# Patient Record
Sex: Female | Born: 1995 | Race: Black or African American | Hispanic: No | Marital: Single | Smoking: Never smoker
Health system: Southern US, Community
[De-identification: ages and names within clinical notes are randomized; demographics above are authoritative.]

## PROBLEM LIST (undated history)

## (undated) DIAGNOSIS — L309 Dermatitis, unspecified: Secondary | ICD-10-CM

## (undated) DIAGNOSIS — L409 Psoriasis, unspecified: Secondary | ICD-10-CM

## (undated) HISTORY — PX: FOOT SURGERY: SHX648

---

## 2018-12-29 ENCOUNTER — Other Ambulatory Visit: Payer: Self-pay

## 2018-12-29 DIAGNOSIS — Z20822 Contact with and (suspected) exposure to covid-19: Secondary | ICD-10-CM

## 2018-12-30 LAB — NOVEL CORONAVIRUS, NAA: SARS-CoV-2, NAA: NOT DETECTED

## 2019-04-14 ENCOUNTER — Ambulatory Visit (HOSPITAL_COMMUNITY)
Admission: EM | Admit: 2019-04-14 | Discharge: 2019-04-14 | Disposition: A | Discharge status: Home / Self Care | Payer: Managed Care, Other (non HMO) | Attending: Physician Assistant | Admitting: Physician Assistant

## 2019-04-14 ENCOUNTER — Encounter (HOSPITAL_COMMUNITY): Payer: Self-pay

## 2019-04-14 ENCOUNTER — Ambulatory Visit (INDEPENDENT_AMBULATORY_CARE_PROVIDER_SITE_OTHER): Payer: Managed Care, Other (non HMO)

## 2019-04-14 ENCOUNTER — Other Ambulatory Visit: Payer: Self-pay

## 2019-04-14 DIAGNOSIS — T148XXA Other injury of unspecified body region, initial encounter: Secondary | ICD-10-CM

## 2019-04-14 DIAGNOSIS — S93105A Unspecified dislocation of left toe(s), initial encounter: Secondary | ICD-10-CM | POA: Diagnosis not present

## 2019-04-14 DIAGNOSIS — M79675 Pain in left toe(s): Secondary | ICD-10-CM | POA: Diagnosis not present

## 2019-04-14 MED ORDER — IBUPROFEN 600 MG PO TABS: 600.0000 mg | ORAL_TABLET | Freq: Four times a day (QID) | ORAL | 0 refills | Status: DC | PRN

## 2019-04-14 NOTE — ED Triage Notes (Signed)
Pt states she hit her toe on the corner of the wall. Pt states she thinks she broke her toe. This happened yesterday.

## 2019-04-14 NOTE — Discharge Instructions (Signed)
Wear the boot and keep your toes taped. Take the ibuprofen every 6 hours for pain  Please follow-up with the orthopedics clinic that was supplied.  Give them a call today to see if you can be seen sometime this later this week.  I free to follow-up as there is concerned about possible redislocation.

## 2019-04-14 NOTE — ED Provider Notes (Signed)
[facility]    CSN: [id number] Arrival date & time: 04/14/19  2409      History   Chief Complaint Chief Complaint  Patient presents with  . Toe Injury    HPI Marcia Berger is a 24 y.o. female.   Reports urgent care today for acute injury to left toe.  She reports she was running in her apartment last night when she slammed this into a door jam.  She noted immediate pain in her fourth toe on her left foot.  She denies remembering any bleeding.  She does endorse some deformity in which she believes is angulation towards the outside of her foot of the toe.  Is also discoloration slight swelling.  She reports her mother is a Engineer, civil (consulting) and she instructed her to wrap the toe and be seen today.  She reports 8 out of 10 pain with walking but while resting the pain is not very significant.  She denies numbness or tingling in the toe.     History reviewed. No pertinent past medical history.  There are no problems to display for this patient.   History reviewed. No pertinent surgical history.  OB History   No obstetric history on file.      Home Medications    Prior to Admission medications   Medication Sig Start Date End Date Taking? Authorizing Provider  ibuprofen (ADVIL) 600 MG tablet Take 1 tablet (600 mg total) by mouth every 6 (six) hours as needed. 04/14/19   Sonia Stickels, Veryl Speak, PA-C    Family History History reviewed. No pertinent family history.  Social History Social History   Tobacco Use  . Smoking status: Never Smoker  . Smokeless tobacco: Never Used  Substance Use Topics  . Alcohol use: Never  . Drug use: Never     Allergies   Patient has no known allergies.   Review of Systems Review of Systems  Musculoskeletal: Positive for gait problem.       Left toe pain  Skin: Positive for color change. Negative for wound.     Physical Exam Triage Vital Signs ED Triage Vitals  Enc Vitals Group     BP 04/14/19 1019 116/67     Pulse Rate 04/14/19  1019 69     Resp 04/14/19 1019 16     Temp 04/14/19 1019 97.8 F (36.6 C)     Temp Source 04/14/19 1019 Oral     SpO2 04/14/19 1019 100 %     Weight 04/14/19 1017 151 lb (68.5 kg)     Height --      Head Circumference --      Peak Flow --      Pain Score 04/14/19 1017 8     Pain Loc --      Pain Edu? --      Excl. in [place]? --    No data found.  Updated Vital Signs BP 116/67 (BP Location: Right Arm)   Pulse 69   Temp 97.8 F (36.6 C) (Oral)   Resp 16   Wt 151 lb (68.5 kg)   LMP 11/14/2018 Comment: implant, denies pregnancy  SpO2 100%   Visual Acuity Right Eye Distance:   Left Eye Distance:   Bilateral Distance:    Right Eye Near:   Left Eye Near:    Bilateral Near:     Physical Exam Vitals and nursing note reviewed.  Constitutional:      General: She is not in acute distress.  Appearance: She is well-developed. She is not ill-appearing.  HENT:     Head: Normocephalic and atraumatic.  Eyes:     Conjunctiva/sclera: Conjunctivae normal.  Cardiovascular:     Rate and Rhythm: Normal rate.  Pulmonary:     Effort: Pulmonary effort is normal. No respiratory distress.  Musculoskeletal:     Cervical back: Neck supple.     Comments: Left fourth toe with erythema and mild ecchymotic changes.  There is a very small abrasion proximal to the PIP.  Toe is in line however there is indentation deformity at the PIP suggestive of dislocation.  Good sensation and cap refill.  There is mild tenderness at the MTP joint.   Skin:    General: Skin is warm and dry.  Neurological:     Mental Status: She is alert.      UC Treatments / Results  Labs (all labs ordered are listed, but only abnormal results are displayed) Labs Reviewed - No data to display  EKG   Radiology DG Toe 4th Left  Result Date: 04/14/2019 CLINICAL DATA:  Post attempted reduction EXAM: LEFT FOURTH TOE COMPARISON:  Earlier same day FINDINGS: There is persistent abnormal alignment at the proximal  interphalangeal joint of the fourth digit. The middle phalanx is laterally and now dorsally positioned relative to the proximal phalanx. Small curvilinear avulsed fragment is again noted. IMPRESSION: Persistent dislocation at the proximal interphalangeal joint of the fourth digit. The middle phalanx is laterally and now dorsally positioned relative to the proximal phalanx. Small curvilinear avulsed fragment is again noted. Electronically Signed   By: Guadlupe Spanish M.D.   On: 04/14/2019 12:31   DG Toe 4th Left  Result Date: 04/14/2019 CLINICAL DATA:  hit toe on wall.  Pain, swelling EXAM: LEFT FOURTH TOE COMPARISON:  None. FINDINGS: Plantar dislocation at the left 4th toe PIP joint. Small avulsed fragment noted lateral to the distal aspect of the proximal phalanx. No additional acute bony abnormality. IMPRESSION: Plantar dislocation at the left 4th toe PIP joint with small avulsed bone fragment laterally. Electronically Signed   By: Charlett Nose M.D.   On: 04/14/2019 11:01    Procedures Orthopedic Injury Treatment  Date/Time: 04/14/2019 12:09 PM Performed by: Mardella Layman, MD Authorized by: Mardella Layman, MD   Consent:    Consent obtained:  Verbal   Consent given by:  Patient   Risks discussed:  Fracture, recurrent dislocation and irreducible dislocation   Alternatives discussed:  No treatment, immobilization and referralInjury location: toe Location details: left fourth toe Injury type: dislocation Dislocation type: PIP Pre-procedure neurovascular assessment: neurovascularly intact Pre-procedure distal perfusion: normal Pre-procedure neurological function: normal Pre-procedure range of motion: reduced Anesthesia: digital block  Anesthesia: Local anesthesia used: yes Local Anesthetic: lidocaine 2% without epinephrine Anesthetic total: 2.5 mL Manipulation performed: yes Reduction successful: Partially there is now slight dorsal location and lateral as opposed to previous plantar  dislocation. X-ray confirmed reduction: yes Immobilization: tape (Postoperative boot) Post-procedure neurovascular assessment: post-procedure neurovascularly intact Post-procedure distal perfusion: normal Post-procedure neurological function: normal Patient tolerance: patient tolerated the procedure well with no immediate complications    (including critical care time)  Medications Ordered in UC Medications - No data to display  Initial Impression / Assessment and Plan / UC Course  I have reviewed the triage vital signs and the nursing notes.  Pertinent labs & imaging results that were available during my care of the patient were reviewed by me and considered in my medical decision making (see chart for  details).     #Plantar dislocation of fourth toe on left foot w/ with small avulsion fracture Patient 24 year old female presenting with acute left toe injury sustained yesterday.  Though there is a small abrasion at the PIP do not believe that there is communication between this wound and fracture/dislocation.  Clinical and radiographic evidence of plantar dislocation at PIP.  Relocation attempted by Dr. Mannie Stabile attending physician on.  There was some improvement and believe that there was relocation however immediately after relocation we have to redislocated.  Radiographic evidence now of slight dorsal and lateral displacement.  Given this I do believe there is a likely ligamentous injury or tendon injury.  Pulse motor and sensory all intact.   -Will buddy tape applied and put in postop boot instructions to follow-up with orthopedics. -Orthopedic follow-up recommended -Ibuprofen and Tylenol for pain management. 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  Final Clinical Impressions(s) / UC Diagnoses   Final diagnoses:  Closed dislocation of fourth toe of left foot, initial  encounter  Avulsion fracture     Discharge Instructions     Wear the boot and keep your toes taped. Take the ibuprofen every 6 hours for pain  Please follow-up with the orthopedics clinic that was supplied.  Give them a call today to see if you can be seen sometime this later this week.  I free to follow-up as there is concerned about possible redislocation.      ED Prescriptions    Medication Sig Dispense Auth. Provider   ibuprofen (ADVIL) 600 MG tablet Take 1 tablet (600 mg total) by mouth every 6 (six) hours as needed. 30 tablet Dakotah Heiman, Marguerita Beards, PA-C     PDMP not reviewed this encounter.   Purnell Shoemaker, PA-C 04/14/19 1300

## 2021-09-27 IMAGING — DX DG TOE 4TH 2+V*L*
3 series · 3 of 3 positions shown · non-contrast
Comparison: None.

CLINICAL DATA: hit toe on wall.  Pain, swelling

EXAM:
LEFT FOURTH TOE

[toe ap]
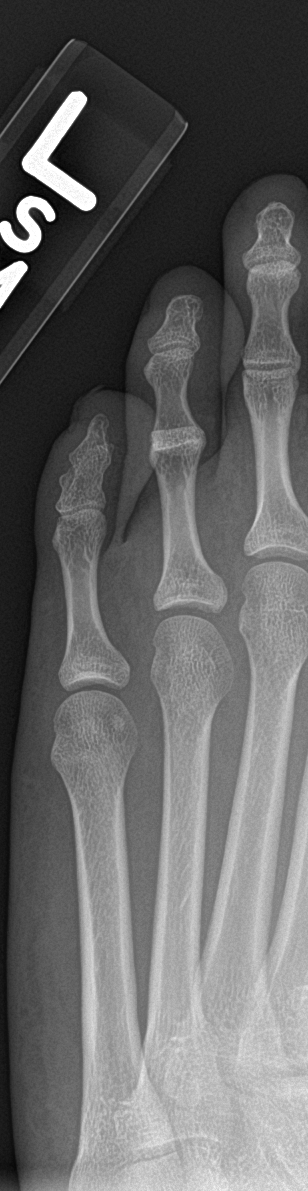

[toe obl]
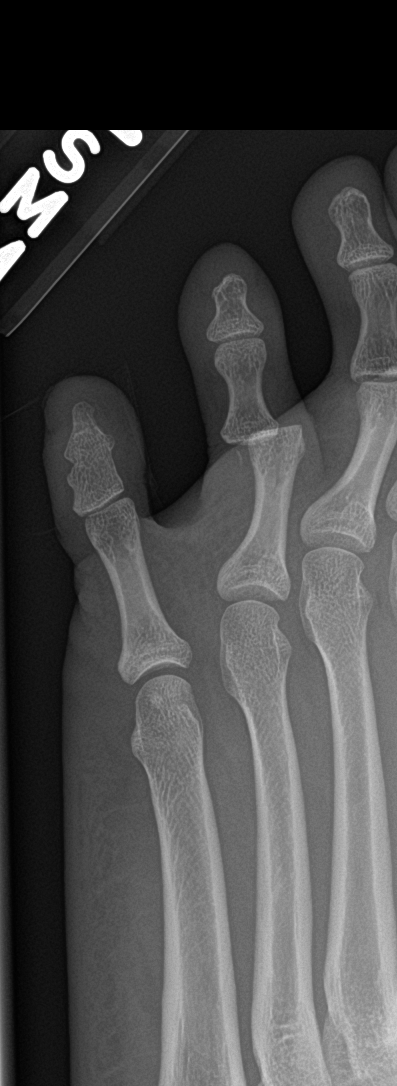

[toe lat]
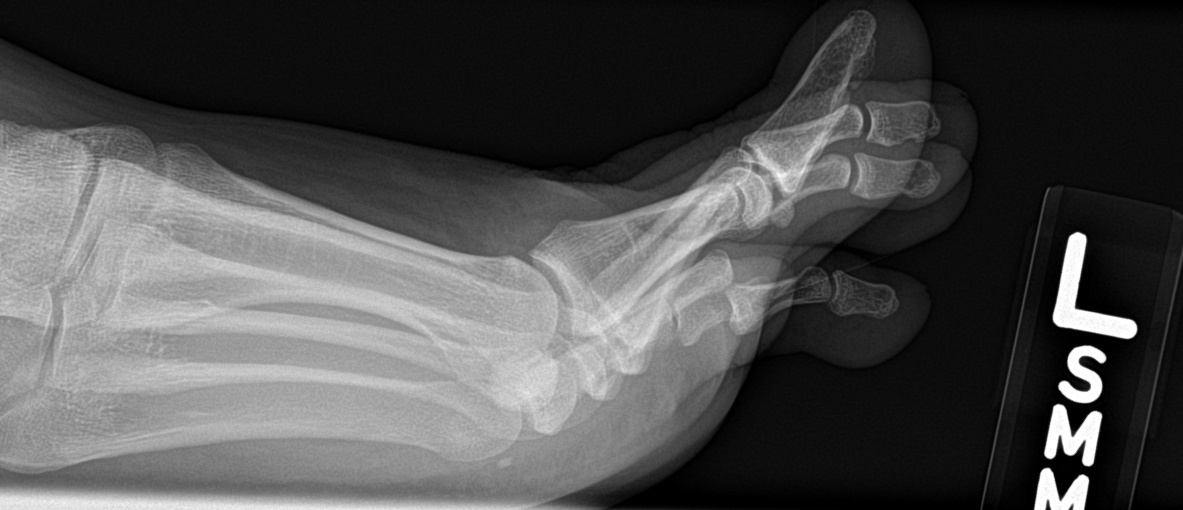

[3 of 3 positions shown; findings below may reference images not displayed]

FINDINGS: Plantar dislocation at the left 4th toe PIP joint. Small avulsed
fragment noted lateral to the distal aspect of the proximal phalanx.
No additional acute bony abnormality.
IMPRESSION: Plantar dislocation at the left 4th toe PIP joint with small avulsed
bone fragment laterally.

## 2022-04-18 ENCOUNTER — Ambulatory Visit (INDEPENDENT_AMBULATORY_CARE_PROVIDER_SITE_OTHER): Payer: 59

## 2022-04-18 ENCOUNTER — Ambulatory Visit (HOSPITAL_COMMUNITY)
Admission: EM | Admit: 2022-04-18 | Discharge: 2022-04-18 | Disposition: A | Discharge status: Home / Self Care | Payer: 59

## 2022-04-18 ENCOUNTER — Encounter (HOSPITAL_COMMUNITY): Payer: Self-pay

## 2022-04-18 DIAGNOSIS — M25471 Effusion, right ankle: Secondary | ICD-10-CM | POA: Diagnosis not present

## 2022-04-18 DIAGNOSIS — M25571 Pain in right ankle and joints of right foot: Secondary | ICD-10-CM

## 2022-04-18 HISTORY — DX: Dermatitis, unspecified: L30.9

## 2022-04-18 HISTORY — DX: Psoriasis, unspecified: L40.9

## 2022-04-18 NOTE — ED Provider Notes (Signed)
[place]    CSN: [id number] Arrival date & time: 04/18/22  1639      History   Chief Complaint Chief Complaint  Patient presents with   Foot Pain    HPI Marcia Berger is a 27 y.o. female.   Patient presents to clinic for right ankle and foot swelling.  She had a severe sprain in 2013 while dancing, was evaluated at [facility] and had negative imaging.  Her right ankle pain has been worsening over the past 2 weeks.  Earlier this week she went to see [employer] in concert in [place], a 6 hour drive there and 6 hour drive back.  Her mother got her a compression stocking for this trip and she wore it.  Her swelling got worse when she returned Wednesday and throughout the past few days.  She denies chest pain, shortness of breath, fever, calf pain, or any new ankle injuries. Ambulatory.  Reports right outer ankle bruising / discoloration with no known injury.   The history is provided by the patient and medical records.  Foot Pain Pertinent negatives include no chest pain, no abdominal pain and no shortness of breath.    Past Medical History:  Diagnosis Date   Eczema    Psoriasis     There are no problems to display for this patient.   Past Surgical History:  Procedure Laterality Date   FOOT SURGERY Left     OB History   No obstetric history on file.      Home Medications    Prior to Admission medications   Medication Sig Start Date End Date Taking? Authorizing Provider  Etonogestrel (NEXPLANON [place]) Inject into the skin.   Yes [provider]    Family History History reviewed. No pertinent family history.  Social History Social History   Tobacco Use   Smoking status: Never   Smokeless tobacco: Never  Vaping Use   Vaping Use: Never used  Substance Use Topics   Alcohol use: Yes    Comment: socially   Drug use: Never     Allergies   Shellfish allergy   Review of Systems Review of Systems  Constitutional:  Negative for fatigue  and fever.  Respiratory:  Negative for cough and shortness of breath.   Cardiovascular:  Negative for chest pain.  Gastrointestinal:  Negative for abdominal pain.  Musculoskeletal:  Positive for joint swelling.     Physical Exam Triage Vital Signs ED Triage Vitals  Enc Vitals Group     BP 04/18/22 1706 (!) 158/77     Pulse Rate 04/18/22 1706 85     Resp 04/18/22 1706 16     Temp 04/18/22 1706 98.2 F (36.8 C)     Temp Source 04/18/22 1706 Oral     SpO2 04/18/22 1706 98 %     Weight 04/18/22 1706 170 lb (77.1 kg)     Height 04/18/22 1706 5\' 4"  (1.626 m)     Head Circumference --      Peak Flow --      Pain Score 04/18/22 1704 3     Pain Loc --      Pain Edu? --      Excl. in [place]? --    No data found.  Updated Vital Signs BP (!) 158/77 (BP Location: Right Arm)   Pulse 85   Temp 98.2 F (36.8 C) (Oral)   Resp 16   Ht 5\' 4"  (1.626 m)   Wt 170 lb (77.1  kg)   LMP  (LMP Unknown)   SpO2 98%   BMI 29.18 kg/m   Visual Acuity Right Eye Distance:   Left Eye Distance:   Bilateral Distance:    Right Eye Near:   Left Eye Near:    Bilateral Near:     Physical Exam Vitals and nursing note reviewed.  Constitutional:      Appearance: Normal appearance.  HENT:     Head: Normocephalic and atraumatic.  Cardiovascular:     Rate and Rhythm: Normal rate and regular rhythm.  Pulmonary:     Effort: Pulmonary effort is normal. No respiratory distress.  Musculoskeletal:        General: Swelling and tenderness present. No deformity or signs of injury. Normal range of motion.     Right lower leg: Tenderness and bony tenderness present. 3+ Edema present.     Left lower leg: 2+ Edema present.     Comments: Bony tenderness over medial malleolus with diffuse soft tissue swelling, 3+, nonpitting.  Left lower extremity and ankle swelling 2+ nonpitting.  Range of motion intact, pain with plantar flexion in anterior ankle.   Skin:    General: Skin is warm and dry.     Capillary Refill:  Capillary refill takes less than 2 seconds.  Neurological:     Mental Status: She is alert and oriented to person, place, and time.  Psychiatric:        Mood and Affect: Mood normal.        Behavior: Behavior is cooperative.      UC Treatments / Results  Labs (all labs ordered are listed, but only abnormal results are displayed) Labs Reviewed - No data to display  EKG   Radiology DG Ankle Complete Right  Result Date: 04/18/2022 CLINICAL DATA:  Ankle pain and swelling. EXAM: RIGHT ANKLE - COMPLETE 3+ VIEW COMPARISON:  None Available. FINDINGS: Moderate lateral malleolar soft tissue swelling. The ankle mortise is symmetric and intact. Minimal dorsal talar neck degenerative spurring. Tiny plantar calcaneal heel spur. No acute fracture or dislocation. IMPRESSION: Moderate lateral malleolar soft tissue swelling. No acute fracture. Electronically Signed   By: Yvonne Kendall M.D.   On: 04/18/2022 17:48    Procedures Procedures (including critical care time)  Medications Ordered in UC Medications - No data to display  Initial Impression / Assessment and Plan / UC Course  I have reviewed the triage vital signs and the nursing notes.  Pertinent labs & imaging results that were available during my care of the patient were reviewed by me and considered in my medical decision making (see chart for details).  Vitals in triage reviewed, patient is hemodynamically stable blood pressure improved on re-check.  Patient with bilateral ankle swelling at baseline, right is usually worse than the left due to previous traumatic ankle sprain in 2013.  Over the past few weeks she has had increased in swelling, recently had a prolonged trip with standing at a concert, reports right ankle pain, tenderness and increased swelling.  Brisk capillary refill, pedal pulses 2+.  Decided to repeat ankle imaging since last done in 2013, no fractures or dislocations.  Will provide ankle brace for support and suggested  RICE method, suggested follow-up with [facility] for further evaluation.  Low suspicion for blood clot as patient is without tachycardia, fever, cough, shortness of breath or calf pain. Risk factors are recent prolonged travel, nexplanon use.  Discussed strict return and emergency precautions, follow-up care reviewed.  Patient verbalized  understanding, no questions at this time.    Final Clinical Impressions(s) / UC Diagnoses   Final diagnoses:  Right ankle swelling     Discharge Instructions      Please wear the brace as tolerated to help with your swelling and for foot stability.  Please rest, ice, compress and elevate this extremity when you are able to.  Since this has been ongoing issue, I recommend following up with [facility] sports medicine for further evaluation and treatment.  Please seek immediate care if you develop shortness of breath, fever, numbness, tingling or worsening of pain.     ED Prescriptions   None    PDMP not reviewed this encounter.   Shakeema Lippman, [place], [place] 04/18/22 [phone]

## 2022-04-18 NOTE — ED Triage Notes (Signed)
History of sprain in the right foot years ago. Pain is ongoing but getting worse onset 2 weeks ago. No known recent falls or injuries. No meds tried.

## 2022-04-18 NOTE — Discharge Instructions (Addendum)
Please wear the brace as tolerated to help with your swelling and for foot stability.  Please rest, ice, compress and elevate this extremity when you are able to.  Since this has been ongoing issue, I recommend following up with [facility] health sports medicine for further evaluation and treatment.  Please seek immediate care if you develop shortness of breath, fever, numbness, tingling or worsening of pain.

## 2022-05-15 ENCOUNTER — Ambulatory Visit (INDEPENDENT_AMBULATORY_CARE_PROVIDER_SITE_OTHER): Payer: 59 | Admitting: Obstetrics and Gynecology

## 2022-05-15 ENCOUNTER — Other Ambulatory Visit (HOSPITAL_COMMUNITY)
Admission: RE | Admit: 2022-05-15 | Discharge: 2022-05-15 | Disposition: A | Discharge status: Home / Self Care | Payer: 59 | Source: Ambulatory Visit | Attending: Obstetrics and Gynecology | Admitting: Obstetrics and Gynecology

## 2022-05-15 ENCOUNTER — Encounter: Payer: Self-pay | Admitting: Obstetrics and Gynecology

## 2022-05-15 VITALS — BP 121/83 | HR 78 | Ht 64.0 in | Wt 175.2 lb

## 2022-05-15 DIAGNOSIS — Z3040 Encounter for surveillance of contraceptives, unspecified: Secondary | ICD-10-CM | POA: Diagnosis not present

## 2022-05-15 DIAGNOSIS — Z01419 Encounter for gynecological examination (general) (routine) without abnormal findings: Secondary | ICD-10-CM | POA: Diagnosis not present

## 2022-05-15 NOTE — Progress Notes (Signed)
New GYN in office for annual Currently has nexplanon that was placed at [facility] in 2020 Reports last intercourse in September 2023 Reports that she has never had a pap smear

## 2022-05-15 NOTE — Progress Notes (Signed)
   WELL-WOMAN PHYSICAL & PAP Patient name: Marcia Berger MRN [id number]  Date of birth: [birth year] Chief Complaint:   New Patient (Initial Visit)  History of Present Illness:   Marcia Berger is a 27 y.o. G0P0 African American female being seen today for a routine well-woman exam.  Current complaints: swelling in BLE. Had Nexplanon inserted at [facility] in 2020. 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  PCP: none      does not desire labs No LMP recorded (lmp unknown). Patient has had an implant. The current method of family planning is Nexplanon.  Last pap never had.  Last mammogram: n/a. Family h/o breast cancer: No Last colonoscopy: n/a. Family h/o colorectal cancer: No Review of Systems:   Pertinent items are noted in HPI Denies any headaches, blurred vision, fatigue, shortness of breath, chest pain, abdominal pain, abnormal vaginal discharge/itching/odor/irritation, problems with periods, bowel movements, urination, or intercourse unless otherwise stated above. Pertinent History Reviewed:  Reviewed past medical,surgical, social and family history.  Reviewed problem list, medications and allergies. Physical Assessment:   Vitals:   05/15/22 1038  BP: 121/83  Pulse: 78  Weight: 175 lb 3.2 oz (79.5 kg)  Height:  (1.626 m)  Body mass index is 30.07 kg/m.        Physical Examination:   General appearance - well appearing, and in no distress  Mental status - alert, oriented to person, place, and time  Psych:  She has a normal mood and affect  Skin - warm and dry, normal color, no suspicious lesions noted  Chest - effort normal, all lung fields clear to auscultation bilaterally  Heart - normal rate and regular rhythm  Neck:  midline trachea, no thyromegaly or nodules  Breasts - breasts appear normal, no suspicious masses, no skin or nipple changes or  axillary  nodes  Abdomen - soft, nontender, nondistended, no masses or organomegaly  Pelvic - VULVA: normal appearing vulva with no masses, tenderness or lesions  VAGINA: normal appearing vagina with normal color and discharge, no lesions  CERVIX: normal appearing cervix without discharge or lesions, no CMT  Thin prep pap is done with reflex HR HPV cotesting  UTERUS: uterus is felt to be normal size, shape, consistency and nontender   ADNEXA: No adnexal masses or tenderness noted.  Rectal - deferred  Extremities:  No swelling or varicosities noted  No results found for this or any previous visit (from the past 24 hour(s)).  Assessment & Plan:  1) Well woman exam with routine gynecological exam - Cytology - PAP( [facility])  2) Encounter for surveillance of contraceptive device - Advised that FDA has only approved Nexplanon for 3 yrs, but [place] leaves them for 4 years. Either way, hers is expired and needs to be changed. - Schedule Nexplanon Removal/reinsertion per her schedule   Labs/procedures today: pap  Mammogram at age 35 or sooner if problems Colonoscopy at age 54 or sooner if problems  No orders of the defined types were placed in this encounter.   Meds: No orders of the defined types were placed in this encounter.   Follow-up: Return per patient's schedule, for Nexplanon removal/reinsertion.  Raelyn Mora MSN, CNM 05/15/2022 11:06 AM

## 2022-05-19 LAB — CYTOLOGY - PAP: Diagnosis: NEGATIVE

## 2022-05-20 ENCOUNTER — Encounter: Payer: Self-pay | Admitting: Obstetrics and Gynecology

## 2022-07-10 ENCOUNTER — Ambulatory Visit: Payer: 59 | Admitting: Obstetrics and Gynecology

## 2022-08-15 ENCOUNTER — Ambulatory Visit: Payer: 59 | Admitting: Obstetrics and Gynecology

## 2023-06-19 ENCOUNTER — Ambulatory Visit

## 2023-06-19 VITALS — BP 125/84 | HR 99 | Ht 64.0 in | Wt 179.0 lb

## 2023-06-19 DIAGNOSIS — Z3046 Encounter for surveillance of implantable subdermal contraceptive: Secondary | ICD-10-CM | POA: Diagnosis not present

## 2023-06-19 MED ORDER — ETONOGESTREL 68 MG ~~LOC~~ IMPL
68.0000 mg | DRUG_IMPLANT | Freq: Once | SUBCUTANEOUS | Status: AC
  Administered 2023-06-19: 68 mg via SUBCUTANEOUS

## 2023-06-19 NOTE — Progress Notes (Unsigned)
 Pt presents for Nexplanon removal. [facility] [place] Declined new BC method

## 2023-06-19 NOTE — Progress Notes (Unsigned)
   OFFICE PROCEDURE NOTE  History:  Marcia Berger is a 28 y.o. [phone] here today for Nexplanon  removal and reinsertion.   Past Medical History:  Diagnosis Date  . Eczema   . Psoriasis     Past Surgical History:  Procedure Laterality Date  . FOOT SURGERY Left     The following portions of the patient's history were reviewed and updated as appropriate: allergies, current medications, past family history, past medical history, past social history, past surgical history and problem list.   Health Maintenance:  Normal pap on April 2024.  No mammogram on file d/t age.   Review of Systems:  Review of Systems  All other systems reviewed and are negative.   Objective:  Vitals: BP 125/84   Pulse 99   Ht 5\' 4"  (1.626 m)   Wt 179 lb (81.2 kg)   BMI 30.73 kg/m   Physical Exam Vitals reviewed. Exam conducted with a chaperone present [name], [place]).  Constitutional:      Appearance: Normal appearance.  HENT:     Head: Normocephalic and atraumatic.  Eyes:     Conjunctiva/sclera: Conjunctivae normal.  Cardiovascular:     Rate and Rhythm: Normal rate.  Pulmonary:     Effort: Pulmonary effort is normal. No respiratory distress.  Musculoskeletal:     Cervical back: Normal range of motion.  Skin:    General: Skin is warm and dry.     Findings: Rash (Eczema noted) present.  Neurological:     Mental Status: She is alert and oriented to person, place, and time.  Psychiatric:        Mood and Affect: Mood normal.        Behavior: Behavior normal.    Procedure"    Nexplanon  Removal Procedure:  [name] requests removal of Nexplanon  for expiration and reinsertion.  Patient also understands that removal and reinsertion can result in blood loss, infection at removal site, and pain.  Patient verbalizes understanding for all risks as stated above and in the consent and desires to proceed with removal and reinsertion.   Appropriate time out taken.  Patient identified, informed  consent performed, consent signed.  Nexplanon  initially not identified despite extended palpation and BSUS usage. site identified; left arm.  Area prepped in usual sterile fashon. ***ml of 1% lidocaine with epi was used to anesthetize the area at the distal end of the implant. A small stab incision was made right beside the implant on the distal portion.  The Nexplanon  rod was visualized within the sheath. The sheath was gently removed with the scalpel, resulting in the Nexplanon  ejecting from the site without difficulty. There was minimal blood loss and no complications.  Steri-strips were applied over the small incision.  A pressure bandage was applied to reduce any bruising.   The patient tolerated the procedure well and was given post procedure instructions to include: *Remove of pressure dressing and band-aid in 12-24 hours. *Remove of steri-strips after no more than 7 days. *Condom usage encouraged as patient does not desire contraception at this time. *Tylenol or ibuprofen  for insertion pain/discomfort. *Call for any other questions, concerns, or complications.   Assessment & Plan:  *** year old ***   -*** -*** -***    Kraig Peru MSN, CNM 06/19/2023

## 2024-05-16 ENCOUNTER — Ambulatory Visit: Admitting: Dermatology
